# Patient Record
Sex: Female | Born: 1970 | Race: White | Hispanic: No | Marital: Single | Smoking: Current every day smoker
Health system: Southern US, Community
[De-identification: ages and names within clinical notes are randomized; demographics above are authoritative.]

## PROBLEM LIST (undated history)

## (undated) DIAGNOSIS — F32A Depression, unspecified: Secondary | ICD-10-CM

## (undated) DIAGNOSIS — I10 Essential (primary) hypertension: Secondary | ICD-10-CM

## (undated) DIAGNOSIS — F329 Major depressive disorder, single episode, unspecified: Secondary | ICD-10-CM

## (undated) DIAGNOSIS — C801 Malignant (primary) neoplasm, unspecified: Secondary | ICD-10-CM

## (undated) DIAGNOSIS — K219 Gastro-esophageal reflux disease without esophagitis: Secondary | ICD-10-CM

## (undated) DIAGNOSIS — H548 Legal blindness, as defined in USA: Secondary | ICD-10-CM

## (undated) DIAGNOSIS — R519 Headache, unspecified: Secondary | ICD-10-CM

## (undated) DIAGNOSIS — R51 Headache: Secondary | ICD-10-CM

## (undated) DIAGNOSIS — R221 Localized swelling, mass and lump, neck: Secondary | ICD-10-CM

## (undated) HISTORY — PX: TONSILLECTOMY: SUR1361

## (undated) HISTORY — PX: APPENDECTOMY: SHX54

---

## 1997-12-18 DIAGNOSIS — C801 Malignant (primary) neoplasm, unspecified: Secondary | ICD-10-CM

## 1997-12-18 HISTORY — DX: Malignant (primary) neoplasm, unspecified: C80.1

## 2014-01-27 ENCOUNTER — Ambulatory Visit: Payer: Self-pay | Admitting: Family Medicine

## 2014-02-17 ENCOUNTER — Ambulatory Visit: Payer: Self-pay | Admitting: Family Medicine

## 2014-04-02 ENCOUNTER — Ambulatory Visit: Payer: Self-pay | Admitting: Emergency Medicine

## 2014-04-13 ENCOUNTER — Ambulatory Visit: Payer: Self-pay | Admitting: Physician Assistant

## 2017-05-02 ENCOUNTER — Encounter: Payer: Self-pay | Admitting: *Deleted

## 2017-05-02 ENCOUNTER — Ambulatory Visit
Admission: EM | Admit: 2017-05-02 | Discharge: 2017-05-02 | Disposition: A | Discharge status: Home / Self Care | Payer: Self-pay | Attending: Family Medicine | Admitting: Family Medicine

## 2017-05-02 DIAGNOSIS — M26621 Arthralgia of right temporomandibular joint: Secondary | ICD-10-CM

## 2017-05-02 DIAGNOSIS — R609 Edema, unspecified: Secondary | ICD-10-CM

## 2017-05-02 DIAGNOSIS — K118 Other diseases of salivary glands: Secondary | ICD-10-CM

## 2017-05-02 HISTORY — DX: Legal blindness, as defined in USA: H54.8

## 2017-05-02 LAB — CBC WITH DIFFERENTIAL/PLATELET
Basophils Absolute: 0 10*3/uL (ref 0–0.1)
Basophils Relative: 0 %
EOS ABS: 0.2 10*3/uL (ref 0–0.7)
Eosinophils Relative: 2 %
HCT: 42.2 % (ref 35.0–47.0)
HEMOGLOBIN: 14.2 g/dL (ref 12.0–16.0)
Lymphocytes Relative: 41 %
Lymphs Abs: 2.9 10*3/uL (ref 1.0–3.6)
MCH: 35.3 pg — ABNORMAL HIGH (ref 26.0–34.0)
MCHC: 33.7 g/dL (ref 32.0–36.0)
MCV: 104.7 fL — ABNORMAL HIGH (ref 80.0–100.0)
MONO ABS: 0.5 10*3/uL (ref 0.2–0.9)
MONOS PCT: 7 %
NEUTROS PCT: 50 %
Neutro Abs: 3.6 10*3/uL (ref 1.4–6.5)
PLATELETS: 219 10*3/uL (ref 150–440)
RBC: 4.03 MIL/uL (ref 3.80–5.20)
RDW: 12 % (ref 11.5–14.5)
WBC: 7.2 10*3/uL (ref 3.6–11.0)

## 2017-05-02 LAB — BASIC METABOLIC PANEL
Anion gap: 7 (ref 5–15)
BUN: 16 mg/dL (ref 6–20)
CALCIUM: 8.9 mg/dL (ref 8.9–10.3)
CO2: 22 mmol/L (ref 22–32)
CREATININE: 0.54 mg/dL (ref 0.44–1.00)
Chloride: 106 mmol/L (ref 101–111)
Glucose, Bld: 106 mg/dL — ABNORMAL HIGH (ref 65–99)
Potassium: 4.3 mmol/L (ref 3.5–5.1)
SODIUM: 135 mmol/L (ref 135–145)

## 2017-05-02 LAB — MONONUCLEOSIS SCREEN: Mono Screen: NEGATIVE

## 2017-05-02 MED ORDER — AMOXICILLIN-POT CLAVULANATE 875-125 MG PO TABS: 1.0000 | ORAL_TABLET | Freq: Two times a day (BID) | ORAL | 0 refills | Status: DC

## 2017-05-02 NOTE — ED Provider Notes (Signed)
[facility] ____________________________________________  Time seen: Approximately 9:43 AM  I have reviewed the triage vital signs and the nursing notes.   HISTORY  Chief Complaint Otalgia and Sore Throat  HPI Danielle Higgins is a 46 y.o. female presenting for evaluation of right lymph node swelling and right ear pain that is in present for the last several weeks. Patient states this issue has been present for approximately a month, and expresses concern as she started to notice a slight swelling of the lymph node on the left side of her neck. Patient states the area is tender to touch. Denies any dental pain, radiation of pain, paresthesias, skin changes or bone injury. Reports has continued to eat and drink well. Denies actual sore throat and states no pain or discomfort with eating or drinking. Patient states unresolved with over-the-counter medicine. Denies fevers. Reports some nasal congestion and occasional productive cough, but states he congestion and cough is typical for her smoker cough. Denies any sinus pain or sinus pressure. States right ear discomfort is mild. Denies any left ear discomfort. Denies any hearing changes, tinnitus or ear trauma. States mild discomfort at this time. Patient also states that she has right-sided head pain intermittently, and only present when actively chewing. Patient states that she has been told that she grinds her teeth. Denies any pain to her head at this time. Denies any skin changes, trauma or sensation changes. Patient denies any rapid or acute changes of swelling.  Denies known trigger for her presenting complaints. Denies any lip, tongue, oral swelling. Denies any difficulty or discomfort with swallowing. Denies wheezing. Denies others with similar at home.Denies chest pain, shortness of breath, abdominal pain, dysuria, extremity pain, extremity swelling or rash. Denies recent sickness. Denies recent antibiotic use.   Patient's last  menstrual period was 04/11/2017 (approximate).    Past Medical History:  Diagnosis Date  . Legally blind     There are no active problems to display for this patient.   History reviewed. No pertinent surgical history.   No current facility-administered medications for this encounter.   Current Outpatient Prescriptions:  .  amoxicillin-clavulanate (AUGMENTIN) 875-125 MG tablet, Take 1 tablet by mouth every 12 (twelve) hours., Disp: 20 tablet, Rfl: 0  Allergies Patient has no known allergies.  History reviewed. No pertinent family history.  Social History Social History  Substance Use Topics  . Smoking status: Current Every Day Smoker  . Smokeless tobacco: Never Used  . Alcohol use No    Review of Systems Constitutional: No fever/chills Eyes: No visual changes. ENT: As above. Cardiovascular: Denies chest pain. Respiratory: Denies shortness of breath. Gastrointestinal: No abdominal pain.  No nausea, no vomiting.  No diarrhea.  No constipation. Genitourinary: Negative for dysuria. Musculoskeletal: Negative for back pain. Skin: Negative for rash. Neurological: Negative for headaches, focal weakness or numbness.   ____________________________________________   PHYSICAL EXAM:  VITAL SIGNS: ED Triage Vitals  Enc Vitals Group     BP 05/02/17 0915 133/86     Pulse Rate 05/02/17 0915 77     Resp 05/02/17 0915 16     Temp 05/02/17 0915 98.2 F (36.8 C)     Temp Source 05/02/17 0915 Oral     SpO2 05/02/17 0915 98 %     Weight 05/02/17 0917 161 lb (73 kg)     Height 05/02/17 0917 5\' 7"  (1.702 m)     Head Circumference --      Peak Flow --  Pain Score --      Pain Loc --      Pain Edu? --      Excl. in [place]? --     Constitutional: Alert and oriented. Well appearing and in no acute distress. Eyes: Conjunctivae are normal. PERRL. EOMI. ENT      Head: Normocephalic and atraumatic.Mild tenderness to Right TMJ joint, full range of motion, no erythema, no scalp  or head tenderness to palpation and no erythema or skin changes noted.       Ears: nontender, no erythema, normal TMs bilaterally. No surrounding tenderness, swelling or erythema bilaterally.       Nose: No congestion/rhinnorhea.      Mouth/Throat: Mucous membranes are moist.Oropharynx non-erythematous.No tonsillar swelling or exudate. No erythema. No uvular shift or deviation. Neck: No stridor. Supple without meningismus.  Hematological/Lymphatic/Immunilogical: Right submandibular swelling, mild tenderness to direct palpation, nonmobile, no bony tenderness, does not extend above the mandible. Minimal left submandibular swelling, no bony tenderness, nonmobile and nontender. No noted thyromegaly. Cardiovascular: Normal rate, regular rhythm. Grossly normal heart sounds.  Good peripheral circulation. Respiratory: Normal respiratory effort without tachypnea nor retractions. Breath sounds are clear and equal bilaterally. No wheezes, rales, rhonchi. Musculoskeletal: Steady gait. No midline cervical, thoracic or lumbar tenderness to palpation.  Neurologic:  Normal speech and language. No gross focal neurologic deficits are appreciated. Speech is normal. No gait instability.  Skin:  Skin is warm, dry Psychiatric: Mood and affect are normal. Speech and behavior are normal. Patient exhibits appropriate insight and judgment   ___________________________________________   LABS (all labs ordered are listed, but only abnormal results are displayed)  Labs Reviewed  CBC WITH DIFFERENTIAL/PLATELET - Abnormal; Notable for the following:       Result Value   MCV 104.7 (*)    MCH 35.3 (*)    All other components within normal limits  BASIC METABOLIC PANEL - Abnormal; Notable for the following:    Glucose, Bld 106 (*)    All other components within normal limits  MONONUCLEOSIS SCREEN   ____________________________________________  RADIOLOGY  No results  found. ____________________________________________   PROCEDURES Procedures    INITIAL IMPRESSION / ASSESSMENT AND PLAN / ED COURSE  Pertinent labs & imaging results that were available during my care of the patient were reviewed by me and considered in my medical decision making (see chart for details).  Very well-appearing patient. No acute distress. Patient reports she continues to eat and drink well. Denies fevers. Patient does have right submandibular mild swelling with tenderness, no bony tenderness. Mild right TMJ tenderness, head nontender and no rash noted. Patient initially stated symptoms have been present for 1 month, then further stated approximately 3 months. Also per RN patient stated symptoms may have been present for up to a year. Unclear exact timeframe of symptoms present. Discussed with patient multiple etiology concerns including mono, sialoadenitis, salivary stone, including up to cancerous. Discussed in detail with patient we'll evaluate CBC, BMP and mono.Labs reviewed and discussed with patient. Discussed with patient would recommend ear nose and throat close follow-up for potential imaging including possible CT scan, further laboratory work and biopsy. Discussed need for close follow-up. Discussed with patient we'll empirically start patient on oral Augmentin and encouraged rest, fluids and supportive care.Discussed indication, risks and benefits of medications with patient.  Discussed follow up with Primary care physician this week.ENT information given. Discussed follow up and return parameters including no resolution or any worsening concerns. Patient verbalized understanding and agreed to plan.  ____________________________________________   FINAL CLINICAL IMPRESSION(S) / ED DIAGNOSES  Final diagnoses:  Swelling of submandibular gland  Arthralgia of right temporomandibular joint     Discharge Medication List as of 05/02/2017 10:38 AM    START taking these  medications   Details  amoxicillin-clavulanate (AUGMENTIN) 875-125 MG tablet Take 1 tablet by mouth every 12 (twelve) hours., Starting Wed 05/02/2017, Normal        Note: This dictation was prepared with Dragon dictation along with smaller phrase technology. Any transcriptional errors that result from this process are unintentional.         Marylene Land, NP 05/02/17 1218

## 2017-05-02 NOTE — ED Triage Notes (Signed)
Enlarged throat lymph nodes, sore throat, and right ear pain x several weeks. Also, painful chewing on right jaw.

## 2017-05-02 NOTE — Discharge Instructions (Signed)
Take medication as prescribed. Rest. Drink plenty of fluids.   Follow up with Ear Nose and Throat, see above to call today to schedule. Close follow up is important.   Follow up with your primary care physician this week as needed. Return to Urgent care for new or worsening concerns.

## 2017-05-11 ENCOUNTER — Other Ambulatory Visit: Payer: Self-pay | Admitting: Otolaryngology

## 2017-05-15 ENCOUNTER — Other Ambulatory Visit: Payer: Self-pay | Admitting: Otolaryngology

## 2017-05-15 DIAGNOSIS — R221 Localized swelling, mass and lump, neck: Secondary | ICD-10-CM

## 2017-05-17 ENCOUNTER — Encounter: Payer: Self-pay | Admitting: Radiology

## 2017-05-17 ENCOUNTER — Ambulatory Visit
Admission: RE | Admit: 2017-05-17 | Discharge: 2017-05-17 | Disposition: A | Discharge status: Home / Self Care | Payer: Self-pay | Source: Ambulatory Visit | Attending: Otolaryngology | Admitting: Otolaryngology

## 2017-05-17 DIAGNOSIS — R221 Localized swelling, mass and lump, neck: Secondary | ICD-10-CM | POA: Insufficient documentation

## 2017-05-17 MED ORDER — IOPAMIDOL (ISOVUE-300) INJECTION 61%
75.0000 mL | Freq: Once | INTRAVENOUS | Status: AC | PRN
  Administered 2017-05-17: 75 mL via INTRAVENOUS

## 2017-11-16 ENCOUNTER — Other Ambulatory Visit: Payer: Self-pay | Admitting: Otolaryngology

## 2017-11-16 DIAGNOSIS — R599 Enlarged lymph nodes, unspecified: Secondary | ICD-10-CM

## 2017-11-16 DIAGNOSIS — R221 Localized swelling, mass and lump, neck: Secondary | ICD-10-CM

## 2017-12-18 DIAGNOSIS — R221 Localized swelling, mass and lump, neck: Secondary | ICD-10-CM

## 2017-12-18 HISTORY — DX: Localized swelling, mass and lump, neck: R22.1

## 2018-01-01 ENCOUNTER — Ambulatory Visit
Admission: RE | Admit: 2018-01-01 | Discharge: 2018-01-01 | Disposition: A | Discharge status: Home / Self Care | Payer: Medicaid Other | Source: Ambulatory Visit | Attending: Otolaryngology | Admitting: Otolaryngology

## 2018-01-01 ENCOUNTER — Ambulatory Visit: Payer: Self-pay

## 2018-01-01 ENCOUNTER — Other Ambulatory Visit: Payer: Self-pay | Admitting: Student

## 2018-01-01 DIAGNOSIS — R599 Enlarged lymph nodes, unspecified: Secondary | ICD-10-CM

## 2018-01-01 DIAGNOSIS — R221 Localized swelling, mass and lump, neck: Secondary | ICD-10-CM | POA: Diagnosis present

## 2018-01-01 HISTORY — DX: Malignant (primary) neoplasm, unspecified: C80.1

## 2018-01-01 MED ORDER — IOPAMIDOL (ISOVUE-300) INJECTION 61%
75.0000 mL | Freq: Once | INTRAVENOUS | Status: AC | PRN
  Administered 2018-01-01: 75 mL via INTRAVENOUS

## 2018-01-03 ENCOUNTER — Encounter
Admission: RE | Admit: 2018-01-03 | Discharge: 2018-01-03 | Disposition: A | Discharge status: Home / Self Care | Payer: Medicaid Other | Source: Ambulatory Visit | Attending: Otolaryngology | Admitting: Otolaryngology

## 2018-01-03 NOTE — Patient Instructions (Signed)
Your procedure is scheduled on: Monday, January 07, 2018 Report to Same Day Surgery on the 2nd floor in the [facility]. To find out your arrival time, please call [phone] between 1PM - 3PM on: Friday, January 04, 2018  REMEMBER: Instructions that are not followed completely may result in serious medical risk, up to and including death; or upon the discretion of your surgeon and anesthesiologist your surgery may need to be rescheduled.  Do not eat food after midnight the night before your procedure.  No gum chewing or hard candies.  You may however, drink CLEAR liquids up to 2 hours before you are scheduled to arrive at the hospital for your procedure.  Do not drink clear liquids within 2 hours of the start of your surgery.  Clear liquids include: - water  - apple juice without pulp - clear gatorade - black coffee or tea (Do NOT add anything to the coffee or tea) Do NOT drink anything that is not on this list.  Type 1 and Type 2 diabetics should only drink water.  No Alcohol for 24 hours before or after surgery.  No Smoking including e-cigarettes for 24 hours prior to surgery. No chewable tobacco products for at least 6 hours prior to surgery. No nicotine patches on the day of surgery.  Notify your doctor if there is any change in your medical condition (cold, fever, infection).  Do not wear jewelry, make-up, hairpins, clips or nail polish.  Do not wear lotions, powders, or perfumes. You may wear deodorant.  Do not shave 48 hours prior to surgery. Men may shave face and neck.  Contacts and dentures may not be worn into surgery.  Do not bring valuables to the hospital. [facility] is not responsible for any belongings or valuables.   TAKE THESE MEDICATIONS THE MORNING OF SURGERY WITH A SIP OF WATER:    Use CHG Soap or wipes as directed on instruction sheet.  Fleets enema or Magnesium Citrate as directed.  Use inhalers on the day of surgery and bring to the  hospital.  Bring your C-PAP to the hospital with you in case you may have to spend the night.  Stop Metformin and Janumet 2 days prior to surgery.  Take 1/2 of usual insulin dose the night before surgery and none on the morning of surgery.  Follow recommendations from Cardiologist, Pulmonologist or PCP regarding stopping Aspirin, Coumadin, Plavix, Eliquis, Pradaxa, or Pletal.  Stop Anti-inflammatories such as Advil, Aleve, Ibuprofen, Motrin, Naproxen, Naprosyn, Goodie powder, or aspirin products. (May take Tylenol or Acetaminophen if needed.)  Stop ANY OVER THE COUNTER supplements until after surgery. (May continue Vitamin D, Vitamin B, and multivitamin.)  If you are being admitted to the hospital overnight, leave your suitcase in the car. After surgery it may be brought to your room.  If you are being discharged the day of surgery, you will not be allowed to drive home. You will need someone to drive you home and stay with you that night.   If you are taking public transportation, you will need to have a responsible adult to with you.  Please call the number above if you have any questions about these instructions.

## 2018-01-04 ENCOUNTER — Encounter
Admission: RE | Admit: 2018-01-04 | Discharge: 2018-01-04 | Disposition: A | Discharge status: Home / Self Care | Payer: Medicaid Other | Source: Ambulatory Visit | Attending: Otolaryngology | Admitting: Otolaryngology

## 2018-01-04 ENCOUNTER — Inpatient Hospital Stay: Admission: RE | Admit: 2018-01-04 | Payer: Medicaid Other | Source: Ambulatory Visit

## 2018-01-04 ENCOUNTER — Other Ambulatory Visit: Payer: Self-pay

## 2018-01-04 HISTORY — DX: Headache, unspecified: R51.9

## 2018-01-04 HISTORY — DX: Essential (primary) hypertension: I10

## 2018-01-04 HISTORY — DX: Major depressive disorder, single episode, unspecified: F32.9

## 2018-01-04 HISTORY — DX: Headache: R51

## 2018-01-04 HISTORY — DX: Depression, unspecified: F32.A

## 2018-01-04 HISTORY — DX: Localized swelling, mass and lump, neck: R22.1

## 2018-01-04 HISTORY — DX: Gastro-esophageal reflux disease without esophagitis: K21.9

## 2018-01-04 MED ORDER — PHENYLEPHRINE HCL 10 % OP SOLN
Freq: Once | OPHTHALMIC | Status: DC
  Filled 2018-01-04: qty 10

## 2018-01-04 NOTE — OR Nursing (Signed)
Left message on voice mail for patient to call pre admit testing to complete a pre op interview for surgery on Monday.

## 2018-01-04 NOTE — Patient Instructions (Signed)
Your procedure is scheduled on: January 07, 2018  Report to [facility] AT 7:30 AM.   Remember: Instructions that are not followed completely may result in serious medical risk, up to and including death, or upon the discretion of your surgeon and anesthesiologist your surgery may need to be rescheduled.     _X__ 1. Do not eat food after midnight the night before your procedure.                 No gum chewing or hard candies. You may drink clear liquids up to 2 hours                 before you are scheduled to arrive for your surgery- DO not drink clear                 liquids within 2 hours of the start of your surgery.                 Clear Liquids include:  water, apple juice without pulp, clear carbohydrate                 drink such as Clearfast of Gartorade, Black Coffee or Tea (Do not add                 anything to coffee or tea).     _X__ 2.  No Alcohol for 24 hours before or after surgery.   _X__ 3.  Do Not Smoke or use e-cigarettes For 24 Hours Prior to Your Surgery.                 Do not use any chewable tobacco products for at least 6 hours prior to                 surgery.  ____  4.  Bring all medications with you on the day of surgery if instructed.   ____  5.  Notify your doctor if there is any change in your medical condition      (cold, fever, infections).     Do not wear jewelry, make-up, hairpins, clips or nail polish. Do not wear lotions, powders, or perfumes. You may wear deodorant. Do not shave 48 hours prior to surgery. Men may shave face and neck. Do not bring valuables to the hospital.    [facility] is not responsible for any belongings or valuables.  Contacts, dentures or bridgework may not be worn into surgery. Leave your suitcase in the car. After surgery it may be brought to your room. For patients admitted to the hospital, discharge time is determined by your treatment team.   Patients discharged the day of  surgery will not be allowed to drive home.   Please read over the following fact sheets that you were given:                    [facility] PHONE   ____ Take these medicines the morning of surgery with A SIP OF WATER:    1. TYLENOL, IF NEEDED FOR PAIN  2.   3.   4.  5.  6.  ____ Fleet Enema (as directed)   __X__SHOWER WITH ANTIBACTERIAL SOAP ON THE MORNING OF SURGERY  ____ Use inhalers on the day of surgery  ____ Stop ALL ASPIRIN PRODUCTS AS OF TODAY, January 04, 2018  ____ Stop Anti-inflammatories AS OF TODAY, January 04, 2018.  THIS INCLUDES IBUPROFEN /  MOTRIN / ALEVE / ADVIL / NAPROSYN / GOODIES                  POWDER                                                    YOU MAY TAKE TYLENOL    ____ Stop supplements until after surgery.    WEAR COMFORTABLE CLOTHING  PURCHASE A SMALL SPRAY BOTTLE FOR ICE WATER RELIEF ON YOUR THROAT AFTER SURGERY  ____ Bring C-Pap to the hospital.

## 2018-01-07 ENCOUNTER — Ambulatory Visit
Admission: RE | Admit: 2018-01-07 | Discharge: 2018-01-07 | Disposition: A | Discharge status: Home / Self Care | Payer: Medicaid Other | Source: Ambulatory Visit | Attending: Otolaryngology | Admitting: Otolaryngology

## 2018-01-07 ENCOUNTER — Encounter: Payer: Self-pay | Admitting: *Deleted

## 2018-01-07 ENCOUNTER — Ambulatory Visit: Payer: Medicaid Other | Admitting: Registered Nurse

## 2018-01-07 ENCOUNTER — Encounter
Admission: RE | Disposition: A | Discharge status: Home / Self Care | Payer: Self-pay | Source: Ambulatory Visit | Attending: Otolaryngology

## 2018-01-07 DIAGNOSIS — R599 Enlarged lymph nodes, unspecified: Secondary | ICD-10-CM | POA: Insufficient documentation

## 2018-01-07 DIAGNOSIS — I10 Essential (primary) hypertension: Secondary | ICD-10-CM | POA: Diagnosis not present

## 2018-01-07 DIAGNOSIS — J353 Hypertrophy of tonsils with hypertrophy of adenoids: Secondary | ICD-10-CM | POA: Diagnosis not present

## 2018-01-07 DIAGNOSIS — K219 Gastro-esophageal reflux disease without esophagitis: Secondary | ICD-10-CM | POA: Diagnosis not present

## 2018-01-07 DIAGNOSIS — R51 Headache: Secondary | ICD-10-CM | POA: Diagnosis not present

## 2018-01-07 DIAGNOSIS — F17200 Nicotine dependence, unspecified, uncomplicated: Secondary | ICD-10-CM | POA: Insufficient documentation

## 2018-01-07 DIAGNOSIS — F329 Major depressive disorder, single episode, unspecified: Secondary | ICD-10-CM | POA: Insufficient documentation

## 2018-01-07 HISTORY — PX: NASOPHARYNGOSCOPY: SHX5210

## 2018-01-07 HISTORY — PX: TONGUE BIOPSY: SHX6575

## 2018-01-07 HISTORY — PX: DIRECT LARYNGOSCOPY: SHX5326

## 2018-01-07 LAB — POCT PREGNANCY, URINE: Preg Test, Ur: NEGATIVE

## 2018-01-07 SURGERY — BIOPSY, TONGUE
Anesthesia: General

## 2018-01-07 MED ORDER — DEXAMETHASONE SODIUM PHOSPHATE 10 MG/ML IJ SOLN
INTRAMUSCULAR | Status: DC | PRN
  Administered 2018-01-07: 10 mg via INTRAVENOUS

## 2018-01-07 MED ORDER — HYDROCODONE-ACETAMINOPHEN 7.5-325 MG/15ML PO SOLN
15.0000 mL | Freq: Once | ORAL | Status: AC
  Administered 2018-01-07: 15 mL via ORAL

## 2018-01-07 MED ORDER — SUGAMMADEX SODIUM 200 MG/2ML IV SOLN
INTRAVENOUS | Status: DC | PRN
  Administered 2018-01-07: 150 mg via INTRAVENOUS

## 2018-01-07 MED ORDER — FENTANYL CITRATE (PF) 100 MCG/2ML IJ SOLN
INTRAMUSCULAR | Status: AC
  Administered 2018-01-07: 25 ug via INTRAVENOUS
  Filled 2018-01-07: qty 2

## 2018-01-07 MED ORDER — SUGAMMADEX SODIUM 200 MG/2ML IV SOLN
INTRAVENOUS | Status: AC
  Filled 2018-01-07: qty 2

## 2018-01-07 MED ORDER — ONDANSETRON HCL 4 MG/2ML IJ SOLN: 4.0000 mg | Freq: Once | INTRAMUSCULAR | Status: DC | PRN

## 2018-01-07 MED ORDER — MIDAZOLAM HCL 2 MG/2ML IJ SOLN
INTRAMUSCULAR | Status: DC | PRN
  Administered 2018-01-07: 2 mg via INTRAVENOUS

## 2018-01-07 MED ORDER — MIDAZOLAM HCL 2 MG/2ML IJ SOLN
INTRAMUSCULAR | Status: AC
  Filled 2018-01-07: qty 2

## 2018-01-07 MED ORDER — ONDANSETRON HCL 4 MG/2ML IJ SOLN
INTRAMUSCULAR | Status: AC
  Filled 2018-01-07: qty 2

## 2018-01-07 MED ORDER — IPRATROPIUM-ALBUTEROL 0.5-2.5 (3) MG/3ML IN SOLN
RESPIRATORY_TRACT | Status: AC
  Administered 2018-01-07: 3 mL via RESPIRATORY_TRACT
  Filled 2018-01-07: qty 3

## 2018-01-07 MED ORDER — DEXAMETHASONE SODIUM PHOSPHATE 10 MG/ML IJ SOLN
INTRAMUSCULAR | Status: AC
  Filled 2018-01-07: qty 1

## 2018-01-07 MED ORDER — FENTANYL CITRATE (PF) 100 MCG/2ML IJ SOLN
INTRAMUSCULAR | Status: DC | PRN
  Administered 2018-01-07: 75 ug via INTRAVENOUS
  Administered 2018-01-07: 25 ug via INTRAVENOUS

## 2018-01-07 MED ORDER — LIDOCAINE HCL (PF) 2 % IJ SOLN
INTRAMUSCULAR | Status: AC
  Filled 2018-01-07: qty 10

## 2018-01-07 MED ORDER — PROPOFOL 10 MG/ML IV BOLUS
INTRAVENOUS | Status: DC | PRN
  Administered 2018-01-07: 150 mg via INTRAVENOUS

## 2018-01-07 MED ORDER — HYDROMORPHONE HCL 1 MG/ML IJ SOLN
0.2500 mg | INTRAMUSCULAR | Status: DC | PRN
  Administered 2018-01-07 (×3): 0.25 mg via INTRAVENOUS

## 2018-01-07 MED ORDER — HYDROCODONE-ACETAMINOPHEN 7.5-325 MG/15ML PO SOLN
ORAL | Status: AC
  Filled 2018-01-07: qty 15

## 2018-01-07 MED ORDER — FENTANYL CITRATE (PF) 100 MCG/2ML IJ SOLN
INTRAMUSCULAR | Status: AC
  Filled 2018-01-07: qty 2

## 2018-01-07 MED ORDER — ONDANSETRON HCL 4 MG/2ML IJ SOLN
INTRAMUSCULAR | Status: DC | PRN
  Administered 2018-01-07: 4 mg via INTRAVENOUS

## 2018-01-07 MED ORDER — OXYMETAZOLINE HCL 0.05 % NA SOLN
NASAL | Status: AC
  Filled 2018-01-07: qty 15

## 2018-01-07 MED ORDER — FAMOTIDINE 20 MG PO TABS
20.0000 mg | ORAL_TABLET | Freq: Once | ORAL | Status: AC
  Administered 2018-01-07: 20 mg via ORAL

## 2018-01-07 MED ORDER — SUCCINYLCHOLINE CHLORIDE 20 MG/ML IJ SOLN
INTRAMUSCULAR | Status: DC | PRN
  Administered 2018-01-07: 100 mg via INTRAVENOUS

## 2018-01-07 MED ORDER — FAMOTIDINE 20 MG PO TABS
ORAL_TABLET | ORAL | Status: AC
  Filled 2018-01-07: qty 1

## 2018-01-07 MED ORDER — PROPOFOL 10 MG/ML IV BOLUS
INTRAVENOUS | Status: AC
  Filled 2018-01-07: qty 20

## 2018-01-07 MED ORDER — ROCURONIUM BROMIDE 100 MG/10ML IV SOLN
INTRAVENOUS | Status: DC | PRN
  Administered 2018-01-07: 25 mg via INTRAVENOUS
  Administered 2018-01-07: 5 mg via INTRAVENOUS
  Administered 2018-01-07: 10 mg via INTRAVENOUS

## 2018-01-07 MED ORDER — FENTANYL CITRATE (PF) 100 MCG/2ML IJ SOLN
25.0000 ug | INTRAMUSCULAR | Status: AC | PRN
  Administered 2018-01-07 (×6): 25 ug via INTRAVENOUS

## 2018-01-07 MED ORDER — LACTATED RINGERS IV SOLN
INTRAVENOUS | Status: DC
  Administered 2018-01-07: 100 mL/h via INTRAVENOUS

## 2018-01-07 MED ORDER — IPRATROPIUM-ALBUTEROL 0.5-2.5 (3) MG/3ML IN SOLN
3.0000 mL | Freq: Once | RESPIRATORY_TRACT | Status: AC
Start: 2018-01-07 — End: 2018-01-07
  Administered 2018-01-07: 3 mL via RESPIRATORY_TRACT

## 2018-01-07 MED ORDER — LIDOCAINE HCL (CARDIAC) 20 MG/ML IV SOLN
INTRAVENOUS | Status: DC | PRN
  Administered 2018-01-07: 100 mg via INTRAVENOUS

## 2018-01-07 MED ORDER — HYDROMORPHONE HCL 1 MG/ML IJ SOLN
INTRAMUSCULAR | Status: AC
  Administered 2018-01-07: 0.25 mg via INTRAVENOUS
  Filled 2018-01-07: qty 1

## 2018-01-07 MED ORDER — ROCURONIUM BROMIDE 50 MG/5ML IV SOLN
INTRAVENOUS | Status: AC
  Filled 2018-01-07: qty 1

## 2018-01-07 SURGICAL SUPPLY — 29 items
BASIN GRAD PLASTIC 32OZ STRL (MISCELLANEOUS) IMPLANT
BLADE SURG 15 STRL LF DISP TIS (BLADE) ×1 IMPLANT
BLADE SURG 15 STRL SS (BLADE) ×2
CANISTER SUCT 1200ML W/VALVE (MISCELLANEOUS) ×3 IMPLANT
CNTNR SPEC 2.5X3XGRAD LEK (MISCELLANEOUS)
CONT SPEC 4OZ STER OR WHT (MISCELLANEOUS)
CONTAINER SPEC 2.5X3XGRAD LEK (MISCELLANEOUS) IMPLANT
DRAPE TABLE BACK 80X90 (DRAPES) ×3 IMPLANT
DRSG TELFA 4X3 1S NADH ST (GAUZE/BANDAGES/DRESSINGS) ×3 IMPLANT
GAUZE SPONGE 4X4 12PLY STRL (GAUZE/BANDAGES/DRESSINGS) IMPLANT
GLOVE BIO SURGEON STRL SZ7 (GLOVE) ×3 IMPLANT
GLOVE PROTEXIS LATEX SZ 7.5 (GLOVE) ×3 IMPLANT
GOWN STRL REUS W/ TWL LRG LVL3 (GOWN DISPOSABLE) ×2 IMPLANT
GOWN STRL REUS W/TWL LRG LVL3 (GOWN DISPOSABLE) ×4
KIT RM TURNOVER STRD PROC AR (KITS) ×3 IMPLANT
NDL SAFETY ECLIPSE 18X1.5 (NEEDLE) ×1 IMPLANT
NEEDLE FILTER BLUNT 18X 1/2SAF (NEEDLE) ×2
NEEDLE FILTER BLUNT 18X1 1/2 (NEEDLE) ×1 IMPLANT
NEEDLE HYPO 18GX1.5 SHARP (NEEDLE) ×2
NEEDLE HYPO 25GX1X1/2 BEV (NEEDLE) ×3 IMPLANT
NEEDLE SPNL 25GX3.5 QUINCKE BL (NEEDLE) ×3 IMPLANT
PACK HEAD/NECK (MISCELLANEOUS) ×3 IMPLANT
PATTIES SURGICAL .5 X.5 (GAUZE/BANDAGES/DRESSINGS) ×3 IMPLANT
SOL ANTI-FOG 6CC FOG-OUT (MISCELLANEOUS) ×1 IMPLANT
SOL FOG-OUT ANTI-FOG 6CC (MISCELLANEOUS) ×2
SPONGE NEURO XRAY DETECT 1X3 (DISPOSABLE) IMPLANT
SYR CONTROL 10ML (SYRINGE) ×3 IMPLANT
TUBING CONNECTING 10 (TUBING) ×2 IMPLANT
TUBING CONNECTING 10' (TUBING) ×1

## 2018-01-07 NOTE — Anesthesia Procedure Notes (Signed)
Procedure Name: Intubation Performed by: Doreen Salvage, CRNA Pre-anesthesia Checklist: Patient identified, Patient being monitored, Timeout performed, Emergency Drugs available and Suction available Patient Re-evaluated:Patient Re-evaluated prior to induction Oxygen Delivery Method: Circle system utilized Preoxygenation: Pre-oxygenation with 100% oxygen Induction Type: IV induction Ventilation: Mask ventilation without difficulty Laryngoscope Size: 3 and McGraph Grade View: Grade I Tube type: MLT Tube size: 6.0 mm Number of attempts: 1 Airway Equipment and Method: Video-laryngoscopy Placement Confirmation: ETT inserted through vocal cords under direct vision,  positive ETCO2 and breath sounds checked- equal and bilateral Secured at: 23 cm Tube secured with: Tape Dental Injury: Teeth and Oropharynx as per pre-operative assessment  Difficulty Due To: Difficult Airway- due to anterior larynx Future Recommendations: Recommend- induction with short-acting agent, and alternative techniques readily available Comments: Attempted first with MAC 3 blade, grade 3 view. Very poor dentition. Second look with Mcgraph 3 with grade 1 view. Able to pass ETT through vocal cords successfully. +ETCO2, +BBS.

## 2018-01-07 NOTE — Anesthesia Post-op Follow-up Note (Signed)
Anesthesia QCDR form completed.        

## 2018-01-07 NOTE — H&P (Signed)
H&P has been reviewedand patient reevaluated,  and no changes necessary. To be downloaded later.  

## 2018-01-07 NOTE — OR Nursing (Signed)
rx given to pt for Hycet written by Dr. Kathyrn Sheriff

## 2018-01-07 NOTE — Anesthesia Preprocedure Evaluation (Signed)
Anesthesia Evaluation  Patient identified by MRN, date of birth, ID band Patient awake    Reviewed: Allergy & Precautions, NPO status , Patient's Chart, lab work & pertinent test results  Airway Mallampati: III  TM Distance: <3 FB     Dental  (+) Chipped, Caps   Pulmonary neg pulmonary ROS, Current Smoker,    Pulmonary exam normal        Cardiovascular hypertension, Normal cardiovascular exam     Neuro/Psych  Headaches, Legally blind    GI/Hepatic Neg liver ROS, GERD  Medicated,  Endo/Other  negative endocrine ROS  Renal/GU negative Renal ROS     Musculoskeletal negative musculoskeletal ROS (+)   Abdominal Normal abdominal exam  (+)   Peds negative pediatric ROS (+)  Hematology negative hematology ROS (+)   Anesthesia Other Findings Past Medical History: 1999: Cancer ([place])     Comment:  cervical. cauterized all pre cancer cells No date: Depression No date: GERD (gastroesophageal reflux disease) No date: Headache     Comment:  migraines No date: Hypertension     Comment:  toxemia with pregnancy No date: Legally blind 12/2017: Neck mass   Reproductive/Obstetrics                             Anesthesia Physical Anesthesia Plan  ASA: II  Anesthesia Plan: General   Post-op Pain Management:    Induction: Intravenous  PONV Risk Score and Plan:   Airway Management Planned: Oral ETT  Additional Equipment:   Intra-op Plan:   Post-operative Plan: Extubation in OR  Informed Consent: I have reviewed the patients History and Physical, chart, labs and discussed the procedure including the risks, benefits and alternatives for the proposed anesthesia with the patient or authorized representative who has indicated his/her understanding and acceptance.   Dental advisory given  Plan Discussed with: CRNA and Surgeon  Anesthesia Plan Comments:         Anesthesia Quick  Evaluation

## 2018-01-07 NOTE — Op Note (Signed)
01/07/2018  10:09 AM    Danielle Higgins  [id number]   Pre-Op Dx: Enlarged lingual tonsils and tongue base mass with suspicion of tumor, referred pain to right jaw neck, enlarged adenoid pad  Post-op Dx: Enlarged lingual tonsils on both sides with suspicion of tumor.  No discrete tongue base mass other than lingual tonsils, enlarged adenoid pad  Proc: Direct laryngoscopy with biopsy of both lingual tonsils, and vallecula and tongue base.  Direct nasopharyngoscopy with biopsy of adenoid tissue.  Surg:  Danielle Higgins  Anes:  GOT  EBL: 50 mL  Comp: None  Findings: Bilateral enlarged lingual tonsils that are relatively soft and a little rubbery but not firm at all.  There is no discrete mass of the tongue muscle or tongue base that I could feel or find with direct laryngoscopy.  The adenoid pad was enlarged.  Biopsies were taken from the adenoid pad and both lingual tonsils, as well as the midline tongue base area in the vallecula.  Procedure: The patient was given general anesthesia by oral endotracheal intubation.  When she was asleep in a [place] laryngoscope was used to visualize the hypopharynx and larynx.  Posterior walls were clear and so were the lateral walls.  There is abundant amount of lymphoid tissue of the tongue bases bilaterally that split in the midline.  The epiglottis was relatively small but appeared to be normal.  The vocal cords were clear with no sign of lesions and the endolarynx.  The scope was brought out far enough for the epiglottis to fall and as he was looking directly into the vallecula and tongue base area biopsies were done of this area.  I did not see a mass or feel like this was hard granular tissue.  Palpation of this area showed that biopsy was taken down to the hyoid bone area.  The lingual tonsils were then visualized and these were biopsied separately.  These were palpated as well and were fairly rubbery soft and not hard at all.  The right side may have  been just slightly larger than the left.  Biopsies were taken from both sides and it look like fairly normal lymph tissue and did not look granular at all there were no ulcerations noted anywhere in the tongue base.  There was small amount of ooze but no significant bleeding from the biopsy sites.  Afrin was used and the nose was vasoconstricted nose and a 0 degree scope was placed through the nose to visualize the nasopharynx.  There is a small round adenoid pad in the midline that looked totally normal.  There is no swelling near the torus tubarius or laterally on either side.  A cup biting forcep was placed through the right nostril was looking with scope through left nostril and direct biopsies were taken from the adenoid pad on both sides.  All these biopsies were sent for permanent section.  Patient tolerated procedure well.  She was awakened and taken to the recovery room in satisfactory condition.  There were no operative complications.  Dispo:   To PACU to be discharged home  Plan: To follow-up in the office later in the week to go over the path report and look for any signs of cancer tumor that might be causing her referred pain towards her right jaw and ear.  She will use some liquid Tylenol with codeine at home along with liquid Tylenol for pain.  Danielle Higgins  01/07/2018 10:09 AM

## 2018-01-07 NOTE — Anesthesia Postprocedure Evaluation (Signed)
Anesthesia Post Note  Patient: Danielle Higgins  Procedure(s) Performed: TONGUE BIOPSY NEOPLASM BASE OF TONGUE POSTERIOR (N/A ) DIRECT LARYNGOSCOPY AND BIOPSY OF ADENOIDS (N/A ) NASOPHARYNGOSCOPY AND BIOPSY (N/A )  Patient location during evaluation: PACU Anesthesia Type: General Level of consciousness: awake and alert and oriented Pain management: pain level controlled Vital Signs Assessment: post-procedure vital signs reviewed and stable Respiratory status: spontaneous breathing Cardiovascular status: blood pressure returned to baseline Anesthetic complications: no     Last Vitals:  Vitals:   01/07/18 1331 01/07/18 1359  BP: 118/70 115/73  Pulse: 77 83  Resp: 18 18  Temp: 36.9 C 37.1 C  SpO2: 95% 94%    Last Pain:  Vitals:   01/07/18 1359  TempSrc: Temporal  PainSc: 6                  Yarelly Kuba

## 2018-01-07 NOTE — Discharge Instructions (Signed)

## 2018-01-07 NOTE — Transfer of Care (Signed)
Immediate Anesthesia Transfer of Care Note  Patient: Danielle Higgins  Procedure(s) Performed: Procedure(s): TONGUE BIOPSY NEOPLASM BASE OF TONGUE POSTERIOR (N/A) DIRECT LARYNGOSCOPY AND BIOPSY OF ADENOIDS (N/A) NASOPHARYNGOSCOPY AND BIOPSY (N/A)  Patient Location: PACU  Anesthesia Type:General  Level of Consciousness: sedated  Airway & Oxygen Therapy: Patient Spontanous Breathing and Patient connected to face mask oxygen  Post-op Assessment: Report given to RN and Post -op Vital signs reviewed and stable  Post vital signs: Reviewed and stable  Last Vitals:  Vitals:   01/07/18 0753 01/07/18 1028  BP: 126/86 137/90  Pulse: 80 84  Resp: 14 12  Temp: (!) 35.9 C (!) 36.2 C  SpO2: 83% 41%    Complications: No apparent anesthesia complications

## 2018-01-08 LAB — SURGICAL PATHOLOGY

## 2019-02-17 IMAGING — CT CT NECK W/ CM
4 of 6 series · 14 of 35 positions shown, 16 images · IV contrast (iopamidol)
Comparison: CT cervical spine 05/17/2017

ADDENDUM:
These results were called by telephone at the time of interpretation
on 01/01/2018 at [DATE] to Dr. SAILY MONG , who verbally
acknowledged these results.
CLINICAL DATA: Swollen lymph nodes.  History of cervical cancer

EXAM:
CT NECK WITH CONTRAST
TECHNIQUE: Multidetector CT imaging of the neck was performed using the
standard protocol following the bolus administration of intravenous
contrast.
CONTRAST:  75mL 8RXZ5G-BNN IOPAMIDOL (8RXZ5G-BNN) INJECTION 61%

[Series 2: axial neck · axial · 0.54mm/px · z∈[-373,-249]mm · 3 of 125 slices shown, 4 images]
[im 32/125  soft-tissue]
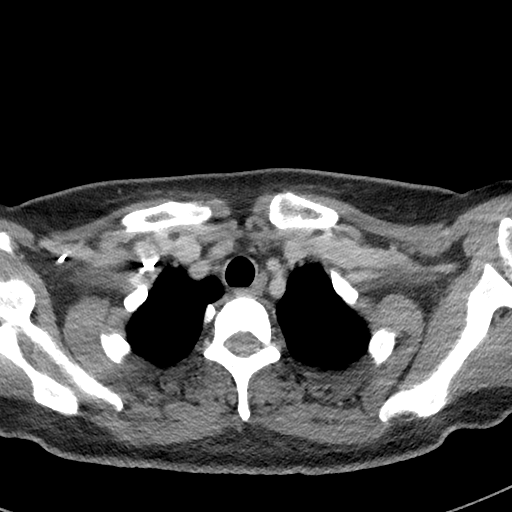
[im 32/125  bone]
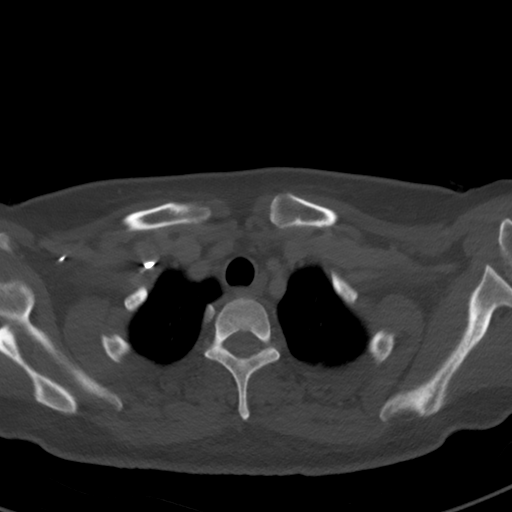
[im 63/125  bone]
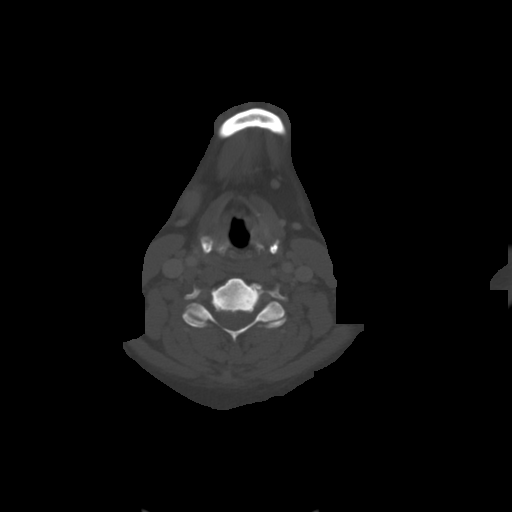
[im 94/125  bone]
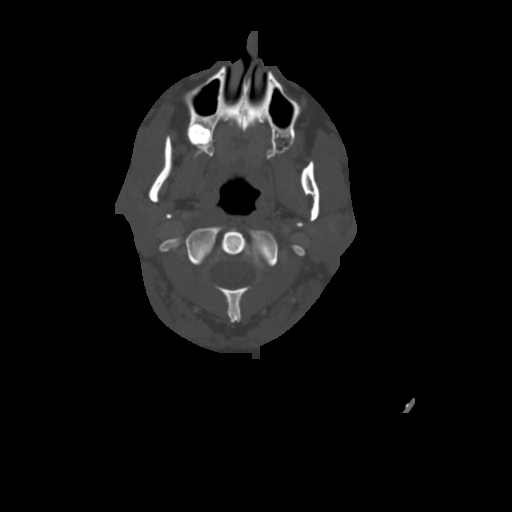

[Series 6: sag neck · sagittal · 0.51mm/px · 5 of 211 slices shown, 6 images]
[im 71/211  bone]
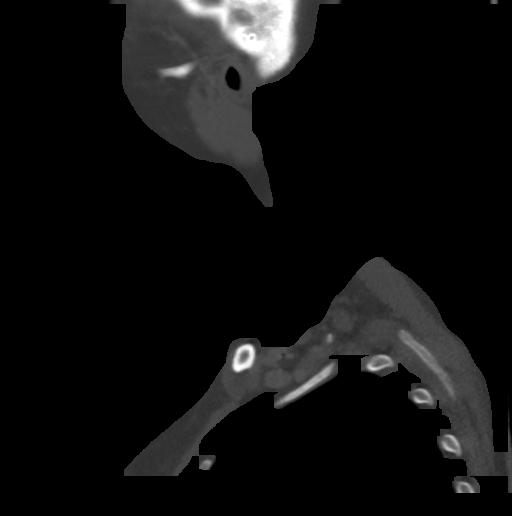
[im 88/211  bone]
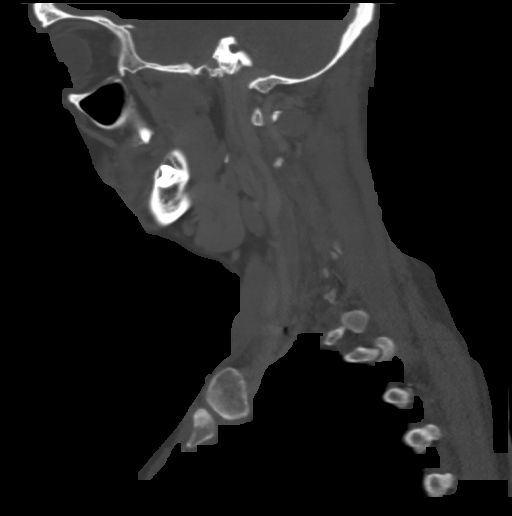
[im 106/211  soft-tissue]
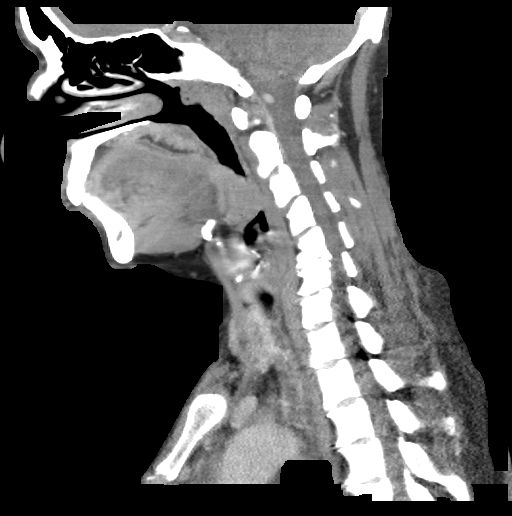
[im 106/211  bone]
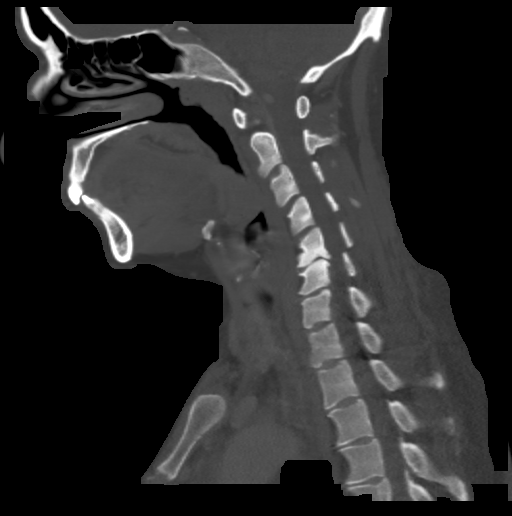
[im 123/211  bone]
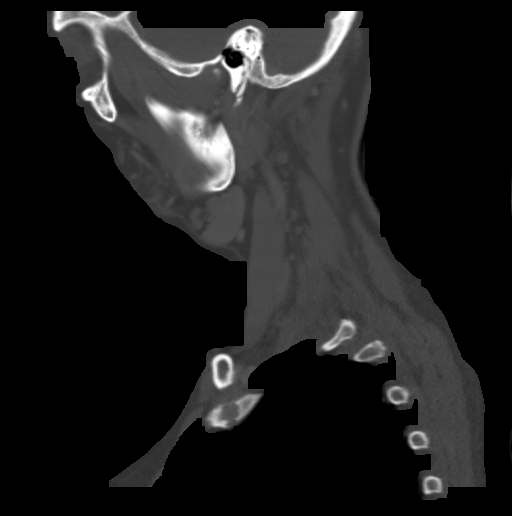
[im 141/211  bone]
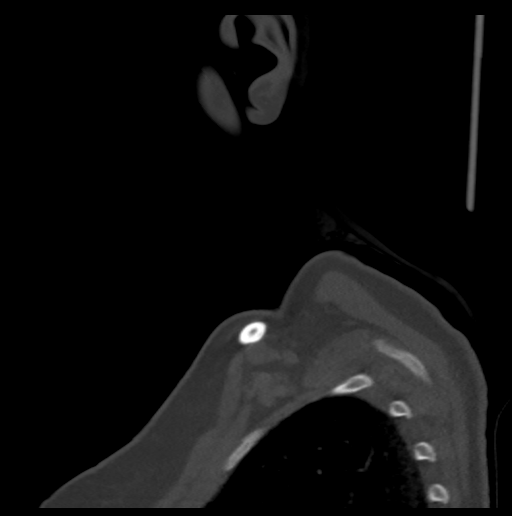

[Series 7: cor neck · coronal · 0.51mm/px · 3 of 130 slices shown]
[im 26/130  bone]
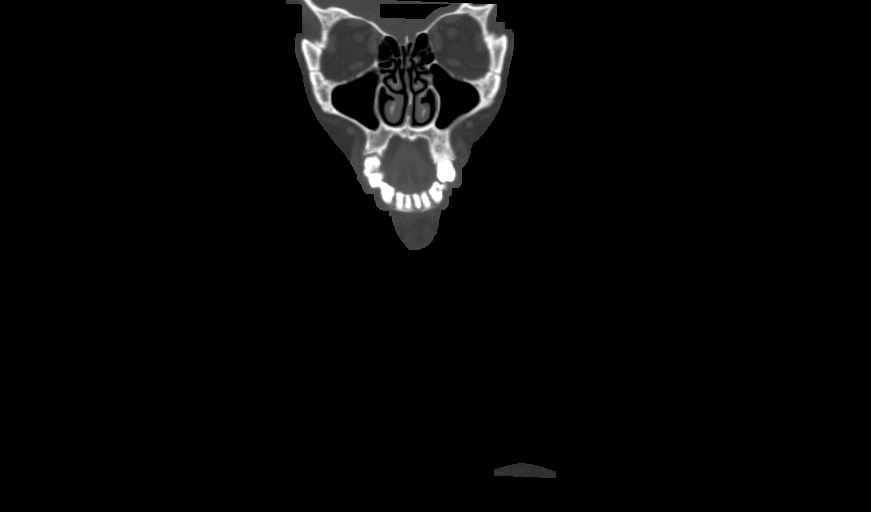
[im 52/130  bone]
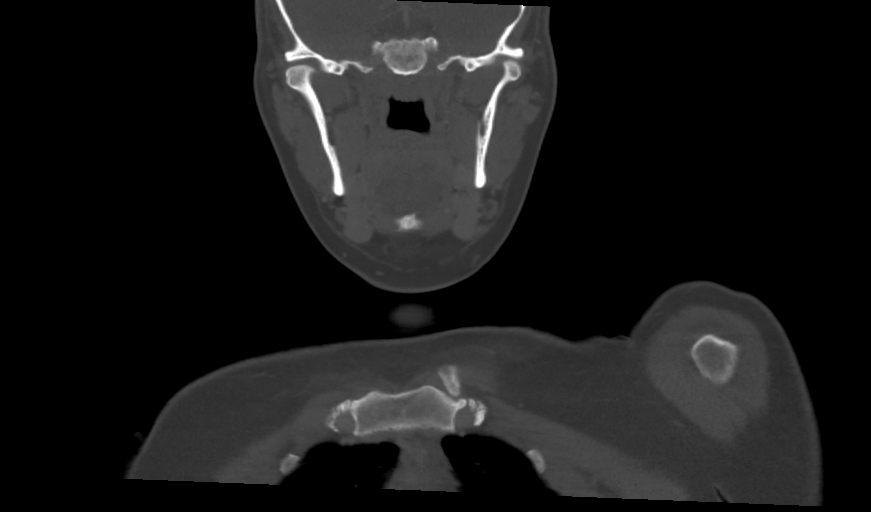
[im 78/130  bone]
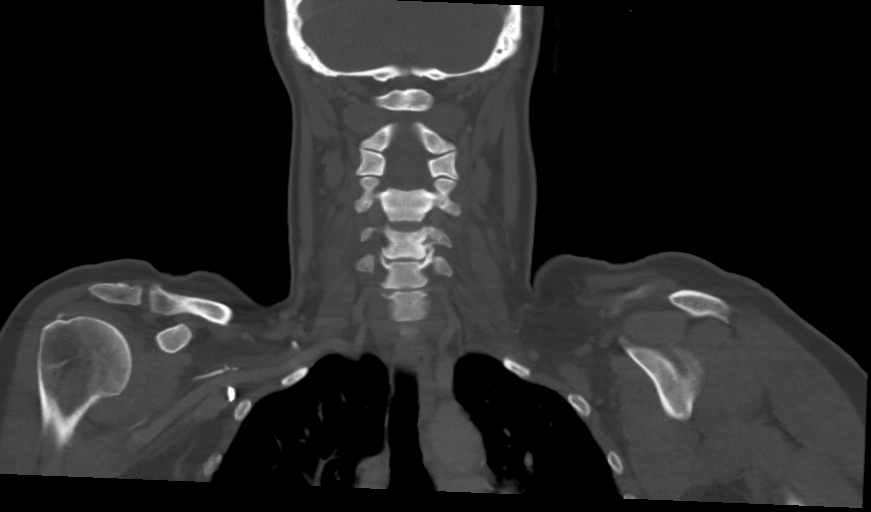

[Series 8: orthogonal ax · axial · 0.51mm/px · z∈[-382,-254]mm · 3 of 129 slices shown]
[im 33/129  bone]
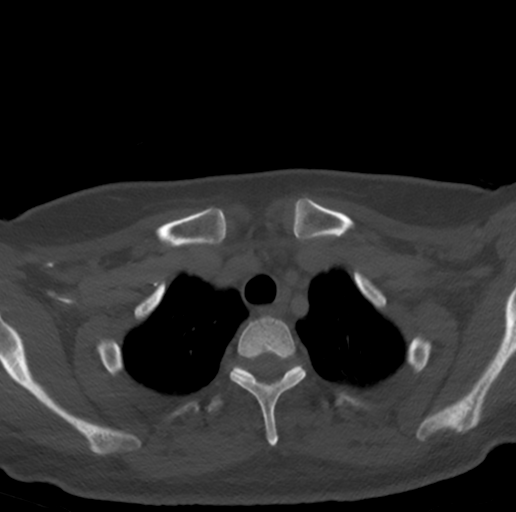
[im 65/129  bone]
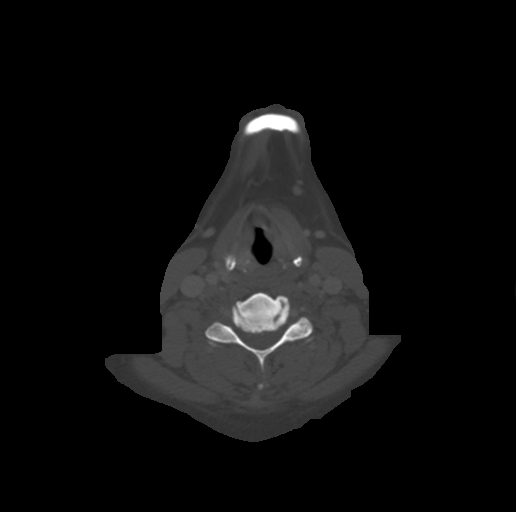
[im 97/129  bone]
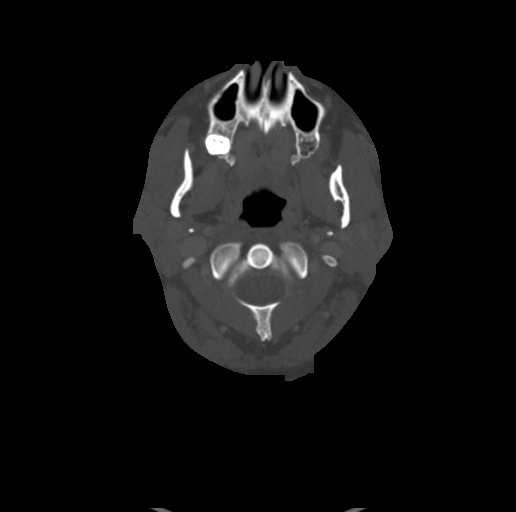

[14 of 35 positions shown; findings below may reference images not displayed]

FINDINGS: Pharynx and larynx: Mass lesion the base the tongue has progressed
in size. This now measures 13 x 21 x 22 mm and extends down to the
epiglottis. Mass is centered in the midline and shows homogeneous
enhancement. This is causing narrowing of the airway in the
oropharynx. Remainder of the pharyngeal soft tissues negative

Salivary glands: No inflammation, mass, or stone.

Thyroid: Negative

Lymph nodes: Right level 2 lymph node 9 mm. Left level 2 lymph node
6 mm. These are similar to the prior study. No pathologic
adenopathy.

Vascular: Negative

Limited intracranial: Negative

Visualized orbits: Negative

Mastoids and visualized paranasal sinuses: Negative

Skeleton: Cervical spine degenerative change without acute skeletal
abnormality.

Upper chest: Lung apices clear

Other: None
IMPRESSION: Interval growth of mass at the base the tongue in the midline,
suspicious for carcinoma. The mass extends down to the epiglottis
and there is significant airway narrowing. No pathologic adenopathy
in neck.

Direct visualization and biopsy recommended.

## 2022-06-11 ENCOUNTER — Emergency Department: Payer: Medicare Other

## 2022-06-11 ENCOUNTER — Other Ambulatory Visit: Payer: Self-pay

## 2022-06-11 ENCOUNTER — Emergency Department
Admission: EM | Admit: 2022-06-11 | Discharge: 2022-06-12 | Disposition: A | Discharge status: Other Acute Inpatient Hospital | Payer: Medicare Other | Attending: Emergency Medicine | Admitting: Emergency Medicine

## 2022-06-11 DIAGNOSIS — R4182 Altered mental status, unspecified: Secondary | ICD-10-CM | POA: Insufficient documentation

## 2022-06-11 DIAGNOSIS — I10 Essential (primary) hypertension: Secondary | ICD-10-CM | POA: Insufficient documentation

## 2022-06-11 DIAGNOSIS — R7401 Elevation of levels of liver transaminase levels: Secondary | ICD-10-CM | POA: Diagnosis not present

## 2022-06-11 DIAGNOSIS — Z20822 Contact with and (suspected) exposure to covid-19: Secondary | ICD-10-CM | POA: Insufficient documentation

## 2022-06-11 DIAGNOSIS — R109 Unspecified abdominal pain: Secondary | ICD-10-CM | POA: Diagnosis not present

## 2022-06-11 DIAGNOSIS — Z85828 Personal history of other malignant neoplasm of skin: Secondary | ICD-10-CM | POA: Diagnosis not present

## 2022-06-11 LAB — URINALYSIS, COMPLETE (UACMP) WITH MICROSCOPIC
Bacteria, UA: NONE SEEN
Glucose, UA: NEGATIVE mg/dL
Hgb urine dipstick: NEGATIVE
Ketones, ur: 5 mg/dL — AB
Leukocytes,Ua: NEGATIVE
Nitrite: NEGATIVE
Protein, ur: 100 mg/dL — AB
Specific Gravity, Urine: 1.026 (ref 1.005–1.030)
pH: 5 (ref 5.0–8.0)

## 2022-06-11 LAB — CBC WITH DIFFERENTIAL/PLATELET
Abs Immature Granulocytes: 0.06 10*3/uL (ref 0.00–0.07)
Basophils Absolute: 0 10*3/uL (ref 0.0–0.1)
Basophils Relative: 0 %
Eosinophils Absolute: 0.1 10*3/uL (ref 0.0–0.5)
Eosinophils Relative: 2 %
HCT: 39.4 % (ref 36.0–46.0)
Hemoglobin: 14.1 g/dL (ref 12.0–15.0)
Immature Granulocytes: 2 %
Lymphocytes Relative: 31 %
Lymphs Abs: 1.1 10*3/uL (ref 0.7–4.0)
MCH: 36.9 pg — ABNORMAL HIGH (ref 26.0–34.0)
MCHC: 35.8 g/dL (ref 30.0–36.0)
MCV: 103.1 fL — ABNORMAL HIGH (ref 80.0–100.0)
Monocytes Absolute: 0.4 10*3/uL (ref 0.1–1.0)
Monocytes Relative: 10 %
Neutro Abs: 2 10*3/uL (ref 1.7–7.7)
Neutrophils Relative %: 55 %
Platelets: 84 10*3/uL — ABNORMAL LOW (ref 150–400)
RBC: 3.82 MIL/uL — ABNORMAL LOW (ref 3.87–5.11)
RDW: 12.1 % (ref 11.5–15.5)
Smear Review: NORMAL
WBC: 3.6 10*3/uL — ABNORMAL LOW (ref 4.0–10.5)
nRBC: 3.1 % — ABNORMAL HIGH (ref 0.0–0.2)

## 2022-06-11 LAB — SARS CORONAVIRUS 2 BY RT PCR: SARS Coronavirus 2 by RT PCR: NEGATIVE

## 2022-06-11 LAB — SALICYLATE LEVEL: Salicylate Lvl: <7 mg/dL — ABNORMAL LOW (ref 7.0–30.0)

## 2022-06-11 LAB — COMPREHENSIVE METABOLIC PANEL
ALT: 1057 U/L — ABNORMAL HIGH (ref 0–44)
AST: 359 U/L — ABNORMAL HIGH (ref 15–41)
Albumin: 3.9 g/dL (ref 3.5–5.0)
Alkaline Phosphatase: 157 U/L — ABNORMAL HIGH (ref 38–126)
Anion gap: 17 — ABNORMAL HIGH (ref 5–15)
BUN: 44 mg/dL — ABNORMAL HIGH (ref 6–20)
CO2: 15 mmol/L — ABNORMAL LOW (ref 22–32)
Calcium: 9.9 mg/dL (ref 8.9–10.3)
Chloride: 102 mmol/L (ref 98–111)
Creatinine, Ser: 1.12 mg/dL — ABNORMAL HIGH (ref 0.44–1.00)
GFR, Estimated: 60 mL/min — ABNORMAL LOW (ref >60)
Glucose, Bld: 119 mg/dL — ABNORMAL HIGH (ref 70–99)
Potassium: 2.6 mmol/L — CL (ref 3.5–5.1)
Sodium: 134 mmol/L — ABNORMAL LOW (ref 135–145)
Total Bilirubin: 3.1 mg/dL — ABNORMAL HIGH (ref 0.3–1.2)
Total Protein: 7.2 g/dL (ref 6.5–8.1)

## 2022-06-11 LAB — BLOOD GAS, VENOUS
Acid-base deficit: 0.9 mmol/L (ref 0.0–2.0)
Bicarbonate: 22 mmol/L (ref 20.0–28.0)
O2 Saturation: 92.1 %
Patient temperature: 37
pCO2, Ven: 31 mmHg — ABNORMAL LOW (ref 44–60)
pH, Ven: 7.46 — ABNORMAL HIGH (ref 7.25–7.43)
pO2, Ven: 62 mmHg — ABNORMAL HIGH (ref 32–45)

## 2022-06-11 LAB — URINE DRUG SCREEN, QUALITATIVE (ARMC ONLY)
Amphetamines, Ur Screen: NOT DETECTED
Barbiturates, Ur Screen: NOT DETECTED
Benzodiazepine, Ur Scrn: NOT DETECTED
Cannabinoid 50 Ng, Ur ~~LOC~~: NOT DETECTED
Cocaine Metabolite,Ur ~~LOC~~: NOT DETECTED
MDMA (Ecstasy)Ur Screen: NOT DETECTED
Methadone Scn, Ur: NOT DETECTED
Opiate, Ur Screen: NOT DETECTED
Phencyclidine (PCP) Ur S: NOT DETECTED
Tricyclic, Ur Screen: POSITIVE — AB

## 2022-06-11 LAB — APTT: aPTT: 28 seconds (ref 24–36)

## 2022-06-11 LAB — ETHANOL: Alcohol, Ethyl (B): <10 mg/dL (ref <10)

## 2022-06-11 LAB — PROCALCITONIN: Procalcitonin: 49.26 ng/mL

## 2022-06-11 LAB — HIV ANTIBODY (ROUTINE TESTING W REFLEX): HIV Screen 4th Generation wRfx: NONREACTIVE

## 2022-06-11 LAB — T4, FREE: Free T4: 1.54 ng/dL — ABNORMAL HIGH (ref 0.61–1.12)

## 2022-06-11 LAB — TSH: TSH: 0.73 u[IU]/mL (ref 0.350–4.500)

## 2022-06-11 LAB — AMMONIA: Ammonia: 66 umol/L — ABNORMAL HIGH (ref 9–35)

## 2022-06-11 LAB — LIPASE, BLOOD: Lipase: 27 U/L (ref 11–51)

## 2022-06-11 LAB — PROTIME-INR
INR: 1.1 (ref 0.8–1.2)
Prothrombin Time: 14.2 seconds (ref 11.4–15.2)

## 2022-06-11 LAB — LACTIC ACID, PLASMA: Lactic Acid, Venous: 1.8 mmol/L (ref 0.5–1.9)

## 2022-06-11 LAB — ACETAMINOPHEN LEVEL: Acetaminophen (Tylenol), Serum: <10 ug/mL — ABNORMAL LOW (ref 10–30)

## 2022-06-11 MED ORDER — IOHEXOL 350 MG/ML SOLN
75.0000 mL | Freq: Once | INTRAVENOUS | Status: AC | PRN
  Administered 2022-06-11: 75 mL via INTRAVENOUS

## 2022-06-11 MED ORDER — POTASSIUM CHLORIDE CRYS ER 20 MEQ PO TBCR
40.0000 meq | EXTENDED_RELEASE_TABLET | Freq: Once | ORAL | Status: AC
  Administered 2022-06-11: 40 meq via ORAL
  Filled 2022-06-11: qty 2

## 2022-06-11 MED ORDER — PIPERACILLIN-TAZOBACTAM 3.375 G IVPB 30 MIN
3.3750 g | Freq: Once | INTRAVENOUS | Status: AC
  Administered 2022-06-11: 3.375 g via INTRAVENOUS
  Filled 2022-06-11: qty 50

## 2022-06-11 MED ORDER — LORAZEPAM 2 MG/ML IJ SOLN
1.0000 mg | Freq: Once | INTRAMUSCULAR | Status: AC
Start: 2022-06-11 — End: 2022-06-11
  Administered 2022-06-11: 1 mg via INTRAVENOUS
  Filled 2022-06-11: qty 1

## 2022-06-11 MED ORDER — PIPERACILLIN-TAZOBACTAM 3.375 G IVPB: 3.3750 g | Freq: Three times a day (TID) | INTRAVENOUS | Status: DC

## 2022-06-11 MED ORDER — DEXTROSE 5 % IV SOLN
15.0000 mg/kg/h | INTRAVENOUS | Status: DC
  Administered 2022-06-11 – 2022-06-12 (×2): 15 mg/kg/h via INTRAVENOUS
  Filled 2022-06-11 (×2): qty 90

## 2022-06-11 MED ORDER — LACTATED RINGERS IV BOLUS
1000.0000 mL | Freq: Once | INTRAVENOUS | Status: AC
  Administered 2022-06-11: 1000 mL via INTRAVENOUS

## 2022-06-11 MED ORDER — IOHEXOL 300 MG/ML  SOLN: 100.0000 mL | Freq: Once | INTRAMUSCULAR | Status: DC | PRN

## 2022-06-11 MED ORDER — POTASSIUM CHLORIDE 10 MEQ/100ML IV SOLN
10.0000 meq | INTRAVENOUS | Status: AC
  Administered 2022-06-11 (×3): 10 meq via INTRAVENOUS
  Filled 2022-06-11 (×4): qty 100

## 2022-06-11 MED ORDER — ACETYLCYSTEINE LOAD VIA INFUSION
150.0000 mg/kg | Freq: Once | INTRAVENOUS | Status: AC
  Administered 2022-06-11: 11565 mg via INTRAVENOUS
  Filled 2022-06-11: qty 380

## 2022-06-11 NOTE — ED Notes (Signed)
Lt green, purple, and red tubes sent down to lab at this time.

## 2022-06-12 DIAGNOSIS — R4182 Altered mental status, unspecified: Secondary | ICD-10-CM | POA: Diagnosis not present

## 2022-06-12 LAB — BLOOD CULTURE ID PANEL (REFLEXED) - BCID2
A.calcoaceticus-baumannii: NOT DETECTED
A.calcoaceticus-baumannii: NOT DETECTED
Bacteroides fragilis: NOT DETECTED
Bacteroides fragilis: NOT DETECTED
CTX-M ESBL: NOT DETECTED
Candida albicans: NOT DETECTED
Candida albicans: NOT DETECTED
Candida auris: NOT DETECTED
Candida auris: NOT DETECTED
Candida glabrata: NOT DETECTED
Candida glabrata: NOT DETECTED
Candida krusei: NOT DETECTED
Candida krusei: NOT DETECTED
Candida parapsilosis: NOT DETECTED
Candida parapsilosis: NOT DETECTED
Candida tropicalis: NOT DETECTED
Candida tropicalis: NOT DETECTED
Carbapenem resist OXA 48 LIKE: NOT DETECTED
Carbapenem resistance IMP: NOT DETECTED
Carbapenem resistance KPC: NOT DETECTED
Carbapenem resistance NDM: NOT DETECTED
Carbapenem resistance VIM: NOT DETECTED
Cryptococcus neoformans/gattii: NOT DETECTED
Cryptococcus neoformans/gattii: NOT DETECTED
Enterobacter cloacae complex: NOT DETECTED
Enterobacter cloacae complex: NOT DETECTED
Enterobacterales: DETECTED — AB
Enterobacterales: NOT DETECTED
Enterococcus Faecium: NOT DETECTED
Enterococcus Faecium: NOT DETECTED
Enterococcus faecalis: NOT DETECTED
Enterococcus faecalis: NOT DETECTED
Escherichia coli: NOT DETECTED
Escherichia coli: NOT DETECTED
Haemophilus influenzae: NOT DETECTED
Haemophilus influenzae: NOT DETECTED
Klebsiella aerogenes: NOT DETECTED
Klebsiella aerogenes: NOT DETECTED
Klebsiella oxytoca: NOT DETECTED
Klebsiella oxytoca: NOT DETECTED
Klebsiella pneumoniae: NOT DETECTED
Klebsiella pneumoniae: NOT DETECTED
Listeria monocytogenes: NOT DETECTED
Listeria monocytogenes: NOT DETECTED
Methicillin resistance mecA/C: DETECTED — AB
Methicillin resistance mecA/C: DETECTED — AB
Neisseria meningitidis: NOT DETECTED
Neisseria meningitidis: NOT DETECTED
Proteus species: NOT DETECTED
Proteus species: NOT DETECTED
Pseudomonas aeruginosa: NOT DETECTED
Pseudomonas aeruginosa: NOT DETECTED
Salmonella species: NOT DETECTED
Salmonella species: NOT DETECTED
Serratia marcescens: NOT DETECTED
Serratia marcescens: NOT DETECTED
Staphylococcus aureus (BCID): NOT DETECTED
Staphylococcus aureus (BCID): NOT DETECTED
Staphylococcus epidermidis: DETECTED — AB
Staphylococcus epidermidis: DETECTED — AB
Staphylococcus lugdunensis: DETECTED — AB
Staphylococcus lugdunensis: NOT DETECTED
Staphylococcus species: DETECTED — AB
Staphylococcus species: DETECTED — AB
Stenotrophomonas maltophilia: NOT DETECTED
Stenotrophomonas maltophilia: NOT DETECTED
Streptococcus agalactiae: NOT DETECTED
Streptococcus agalactiae: NOT DETECTED
Streptococcus pneumoniae: NOT DETECTED
Streptococcus pneumoniae: NOT DETECTED
Streptococcus pyogenes: NOT DETECTED
Streptococcus pyogenes: NOT DETECTED
Streptococcus species: NOT DETECTED
Streptococcus species: NOT DETECTED

## 2022-06-12 LAB — HEPATITIS PANEL, ACUTE
HCV Ab: NONREACTIVE
Hep A IgM: NONREACTIVE
Hep B C IgM: NONREACTIVE
Hepatitis B Surface Ag: NONREACTIVE

## 2022-06-12 LAB — LACTIC ACID, PLASMA: Lactic Acid, Venous: 1.7 mmol/L (ref 0.5–1.9)

## 2022-06-12 LAB — VITAMIN B12: Vitamin B-12: 4499 pg/mL — ABNORMAL HIGH (ref 180–914)

## 2022-06-14 LAB — CULTURE, BLOOD (ROUTINE X 2): Special Requests: ADEQUATE

## 2022-06-16 LAB — CULTURE, BLOOD (ROUTINE X 2)
Culture: NO GROWTH
Special Requests: ADEQUATE

## 2022-09-04 ENCOUNTER — Ambulatory Visit
Admission: EM | Admit: 2022-09-04 | Discharge: 2022-09-04 | Disposition: A | Discharge status: Home / Self Care | Payer: Medicare Other

## 2022-09-04 ENCOUNTER — Ambulatory Visit (INDEPENDENT_AMBULATORY_CARE_PROVIDER_SITE_OTHER): Payer: Medicare Other

## 2022-09-04 DIAGNOSIS — M79661 Pain in right lower leg: Secondary | ICD-10-CM | POA: Diagnosis not present

## 2022-09-04 DIAGNOSIS — M79671 Pain in right foot: Secondary | ICD-10-CM

## 2022-09-04 DIAGNOSIS — W19XXXA Unspecified fall, initial encounter: Secondary | ICD-10-CM | POA: Diagnosis not present

## 2022-09-04 DIAGNOSIS — S82839A Other fracture of upper and lower end of unspecified fibula, initial encounter for closed fracture: Secondary | ICD-10-CM | POA: Diagnosis not present

## 2022-09-04 MED ORDER — OXYCODONE HCL 5 MG PO TABS: 5.0000 mg | ORAL_TABLET | Freq: Four times a day (QID) | ORAL | 0 refills | Status: AC | PRN

## 2022-09-04 NOTE — Discharge Instructions (Addendum)
Your x-ray today shows that you have a fracture of your fibula, the bone on the outside of your leg.  This bone is not a weightbearing bone so you can walk on your right foot with a cam boot as needed.  Keep your right foot elevated is much as possible to help decrease swelling and aid in healing.  He may apply ice to your foot and ankle for 20 minutes at a time 2-3 times a day as needed to help with the pain and swelling as well.  Make sure there is a cloth between your skin and the ice we do not cause skin damage.  I will make a referral to orthopedics for you to follow-up with if your symptoms are not improving, the swelling increases, or your pain worsens.  Take over-the-counter ibuprofen, up to 4 tablets every 8 hours with food, as needed for mild to moderate pain.  Do not exceed 4 tablets every 8 hours.  Take the oxycodone as needed for severe pain.  If you have any increase in pain, swelling, or discoloration of your foot or ankle I recommend you follow-up in the emergency department.  You should contact your orthopedic surgeon who is doing her spinal surgery and let them know that you have a fracture of your ankle.

## 2022-09-04 NOTE — ED Provider Notes (Signed)
[facility]    CSN: [id number] Arrival date & time: 09/04/22  3664      History   Chief Complaint Chief Complaint  Patient presents with   Foot Injury    RT foot    HPI Danielle Higgins is a 51 y.o. female.   HPI  51 year old female here for evaluation of right foot and ankle pain.  Patient reports that she has been having issues with her sciatic nerve in her left leg and typically her leg will give out.  5 nights ago her leg gave out while she was getting out of bed causing the patient to fall and injure her right foot and ankle.  She is unsure of what she hit.  She does complain of numbness and tingling in her toes and states she can move her big toe but she is having trouble moving the rest of her toes.  She also has difficulty bearing weight.  Patient reports that the pain is largely in the front of her ankle and top of her foot but also extends up her shin.  Past Medical History:  Diagnosis Date   Cancer ([place]) 1999   cervical. cauterized all pre cancer cells   Depression    GERD (gastroesophageal reflux disease)    Headache    migraines   Hypertension    toxemia with pregnancy   Legally blind    Neck mass 12/2017    There are no problems to display for this patient.   Past Surgical History:  Procedure Laterality Date   APPENDECTOMY     CESAREAN SECTION     DIRECT LARYNGOSCOPY N/A 01/07/2018   Procedure: DIRECT LARYNGOSCOPY AND BIOPSY OF ADENOIDS;  Surgeon: Margaretha Sheffield, MD;  Location: [facility];  Service: ENT;  Laterality: N/A;   NASOPHARYNGOSCOPY N/A 01/07/2018   Procedure: NASOPHARYNGOSCOPY AND BIOPSY;  Surgeon: Margaretha Sheffield, MD;  Location: [facility];  Service: ENT;  Laterality: N/A;   TONGUE BIOPSY N/A 01/07/2018   Procedure: TONGUE BIOPSY NEOPLASM BASE OF TONGUE POSTERIOR;  Surgeon: Margaretha Sheffield, MD;  Location: [facility];  Service: ENT;  Laterality: N/A;   TONSILLECTOMY      OB History   No obstetric history on file.      Home  Medications    Prior to Admission medications   Medication Sig Start Date End Date Taking? Authorizing Provider  gabapentin (NEURONTIN) 300 MG capsule Take 1 capsule 3 times a day by oral route as needed. 08/28/22  Yes [provider]  oxyCODONE (ROXICODONE) 5 MG immediate release tablet Take 1 tablet (5 mg total) by mouth every 6 (six) hours as needed for severe pain. 09/04/22  Yes Margarette Canada, NP  ibuprofen (ADVIL,MOTRIN) 200 MG tablet Take 200 mg by mouth every 6 (six) hours as needed.    [provider]  naproxen sodium (ALEVE) 220 MG tablet Take 220 mg by mouth 2 (two) times daily as needed.    [provider]    Family History History reviewed. No pertinent family history.  Social History Social History   Tobacco Use   Smoking status: Every Day    Packs/day: 1.00    Types: Cigarettes   Smokeless tobacco: Never  Vaping Use   Vaping Use: Never used  Substance Use Topics   Alcohol use: No   Drug use: No     Allergies   Tylenol [acetaminophen]   Review of Systems Review of Systems  Musculoskeletal:  Positive for arthralgias and joint swelling.  Skin:  Negative for color change.  Neurological:  Positive for numbness.  Hematological: Negative.   Psychiatric/Behavioral: Negative.       Physical Exam Triage Vital Signs ED Triage Vitals  Enc Vitals Group     BP 09/04/22 1001 (!) 146/97     Pulse Rate 09/04/22 1001 (!) 108     Resp --      Temp 09/04/22 1001 98.6 F (37 C)     Temp Source 09/04/22 1001 Oral     SpO2 09/04/22 1001 94 %     Weight 09/04/22 1000 155 lb (70.3 kg)     Height 09/04/22 1000 '5\' 5"'$  (1.651 m)     Head Circumference --      Peak Flow --      Pain Score 09/04/22 0959 10     Pain Loc --      Pain Edu? --      Excl. in [place]? --    No data found.  Updated Vital Signs BP (!) 146/97 (BP Location: Right Arm)   Pulse (!) 108   Temp 98.6 F (37 C) (Oral)   Ht '5\' 5"'$  (1.651 m)   Wt 155 lb (70.3 kg)   LMP  04/11/2017 (Approximate)   SpO2 94%   BMI 25.79 kg/m   Visual Acuity Right Eye Distance:   Left Eye Distance:   Bilateral Distance:    Right Eye Near:   Left Eye Near:    Bilateral Near:     Physical Exam Vitals and nursing note reviewed.  Constitutional:      General: She is in acute distress.     Appearance: Normal appearance.  Musculoskeletal:        General: Swelling, tenderness and signs of injury present. No deformity.  Skin:    General: Skin is warm and dry.     Capillary Refill: Capillary refill takes less than 2 seconds.     Findings: No bruising or erythema.  Neurological:     General: No focal deficit present.     Mental Status: She is alert and oriented to person, place, and time.  Psychiatric:        Mood and Affect: Mood normal.        Behavior: Behavior normal.        Thought Content: Thought content normal.        Judgment: Judgment normal.      UC Treatments / Results  Labs (all labs ordered are listed, but only abnormal results are displayed) Labs Reviewed - No data to display  EKG   Radiology DG Foot Complete Right  Result Date: 09/04/2022 CLINICAL DATA:  Pain and swelling. Fell 5 days ago. Right foot pain and swelling extending up shin. EXAM: RIGHT TIBIA AND FIBULA - 2 VIEW; RIGHT FOOT COMPLETE - 3+ VIEW COMPARISON:  None available FINDINGS: Right tibia and fibula: Normal bone mineralization. There is an oblique fracture of the distal fibular diaphysis and metaphysis. There is 4 mm AP dimension diastasis. Otherwise, no significant displacement. The ankle mortise appears symmetric and intact. There is mild-to-moderate lateral malleolar soft tissue swelling. No knee joint effusion. Right foot: There is mild joint space narrowing of the interphalangeal joints diffusely. No additional acute fracture. IMPRESSION: Oblique fracture of the distal fibular diaphysis and metaphysis with mild AP dimension diastasis but otherwise no significant displacement.  Electronically Signed   By: Yvonne Kendall M.D.   On: 09/04/2022 10:59   DG Tibia/Fibula Right  Result Date: 09/04/2022 CLINICAL  DATA:  Pain and swelling. Fell 5 days ago. Right foot pain and swelling extending up shin. EXAM: RIGHT TIBIA AND FIBULA - 2 VIEW; RIGHT FOOT COMPLETE - 3+ VIEW COMPARISON:  None available FINDINGS: Right tibia and fibula: Normal bone mineralization. There is an oblique fracture of the distal fibular diaphysis and metaphysis. There is 4 mm AP dimension diastasis. Otherwise, no significant displacement. The ankle mortise appears symmetric and intact. There is mild-to-moderate lateral malleolar soft tissue swelling. No knee joint effusion. Right foot: There is mild joint space narrowing of the interphalangeal joints diffusely. No additional acute fracture. IMPRESSION: Oblique fracture of the distal fibular diaphysis and metaphysis with mild AP dimension diastasis but otherwise no significant displacement. Electronically Signed   By: Yvonne Kendall M.D.   On: 09/04/2022 10:59    Procedures Procedures (including critical care time)  Medications Ordered in UC Medications - No data to display  Initial Impression / Assessment and Plan / UC Course  I have reviewed the triage vital signs and the nursing notes.  Pertinent labs & imaging results that were available during my care of the patient were reviewed by me and considered in my medical decision making (see chart for details).   Patient is a pleasant 51 year old female who does appear to be in a moderate degree of pain presenting for evaluation of her right foot and ankle pain x5 days after suffering a ground-level fall when her left leg gave out on her.  She states that this happens frequently due to sciatic issues and she is scheduled to have a surgery on her back to fix that.  Since the fall, she has been unable to bear weight without significant pain she also complains of numbness in her foot, swelling, and decreased range of  motion.  On exam patient does have swelling of her right lower extremity from mid tibia to her toes.  The edema is 2+ nonpitting.  There is no erythema or ecchymosis noted.  DP and PT pulses are 2+.  Patient has significant tenderness with palpation of the distal tibia, ankle joint, and proximal midfoot.  She is able to move her lower leg and she is able to flex her big toe.  The swelling does extend down to her toes.  Her cap refill is less than 2 seconds.  Patient reports that she has not taken anything for pain as it does not help.  I will obtain radiographs of right foot and right tib-fib as this will include the ankle joint to look for the presence of bony abnormality.  Right foot films independently reviewed and evaluated by me.  Impression: No evidence of fracture or dislocation.  There is some mild soft tissue swelling.  Radiology overread is pending. Radiology impression states mild joint space narrowing of the interphalangeal joints diffusely.  No acute fracture.  Right tib-fib films independently reviewed and evaluated by me.  Impression: There is a fracture of the distal fibula without significant displacement.  There is soft tissue swelling present.  The ankle mortise joint is well-maintained.  Radiology overread is pending. Radiology impression states there is a oblique fracture of the distal fibular diaphysis and metaphysis with mild AP dimension diastases but otherwise no significant displacement.  I will discharge patient home with a short prescription of oxycodone she can use for severe pain.  She can continue to use ibuprofen as needed for mild-moderate pain.  She states she is unable to take acetaminophen.  We will also order a cam walker  boot for the patient.  She does have a walker at home that she can use as an assistive device with ambulation.  Review of PDMP does not show any open or chronic scripts.    Final Clinical Impressions(s) / UC Diagnoses   Final diagnoses:  Closed  fracture of distal end of fibula, unspecified fracture morphology, initial encounter     Discharge Instructions      Your x-ray today shows that you have a fracture of your fibula, the bone on the outside of your leg.  This bone is not a weightbearing bone so you can walk on your right foot with a cam boot as needed.  Keep your right foot elevated is much as possible to help decrease swelling and aid in healing.  He may apply ice to your foot and ankle for 20 minutes at a time 2-3 times a day as needed to help with the pain and swelling as well.  Make sure there is a cloth between your skin and the ice we do not cause skin damage.  I will make a referral to orthopedics for you to follow-up with if your symptoms are not improving, the swelling increases, or your pain worsens.  Take over-the-counter ibuprofen, up to 4 tablets every 8 hours with food, as needed for mild to moderate pain.  Do not exceed 4 tablets every 8 hours.  Take the oxycodone as needed for severe pain.  If you have any increase in pain, swelling, or discoloration of your foot or ankle I recommend you follow-up in the emergency department.  You should contact your orthopedic surgeon who is doing her spinal surgery and let them know that you have a fracture of your ankle.     ED Prescriptions     Medication Sig Dispense Auth. Provider   oxyCODONE (ROXICODONE) 5 MG immediate release tablet Take 1 tablet (5 mg total) by mouth every 6 (six) hours as needed for severe pain. 20 tablet Margarette Canada, NP      I have reviewed the PDMP during this encounter.   Margarette Canada, NP 09/04/22 1126

## 2022-09-04 NOTE — ED Triage Notes (Signed)
Pt states she got up the other night, fell x5 days, injured RT foot, swelling up towards shin

## 2022-11-02 ENCOUNTER — Other Ambulatory Visit: Payer: Self-pay | Admitting: Neurology

## 2022-11-02 DIAGNOSIS — G379 Demyelinating disease of central nervous system, unspecified: Secondary | ICD-10-CM

## 2022-11-02 DIAGNOSIS — R202 Paresthesia of skin: Secondary | ICD-10-CM

## 2022-11-23 ENCOUNTER — Ambulatory Visit: Payer: Medicare Other

## 2022-12-18 DEATH — deceased
